# Patient Record
Sex: Female | Born: 1955 | Race: White | Hispanic: No | Marital: Married
Health system: Midwestern US, Academic
[De-identification: ages and names within clinical notes are randomized; demographics above are authoritative.]

---

## 2017-07-05 ENCOUNTER — Encounter: Admit: 2017-07-05 | Discharge: 2017-07-05 | Payer: BC Managed Care – HMO

## 2017-07-13 ENCOUNTER — Encounter: Admit: 2017-07-13 | Discharge: 2017-07-13 | Payer: BC Managed Care – HMO

## 2017-07-13 DIAGNOSIS — F329 Major depressive disorder, single episode, unspecified: ICD-10-CM

## 2017-07-13 DIAGNOSIS — F419 Anxiety disorder, unspecified: ICD-10-CM

## 2017-07-13 DIAGNOSIS — K5790 Diverticulosis of intestine, part unspecified, without perforation or abscess without bleeding: Principal | ICD-10-CM

## 2017-07-13 DIAGNOSIS — I10 Essential (primary) hypertension: ICD-10-CM

## 2017-07-15 ENCOUNTER — Encounter: Admit: 2017-07-15 | Discharge: 2017-07-15 | Payer: BC Managed Care – HMO

## 2017-07-15 ENCOUNTER — Ambulatory Visit: Admit: 2017-07-15 | Discharge: 2017-07-15 | Payer: BC Managed Care – HMO

## 2017-07-15 DIAGNOSIS — I10 Essential (primary) hypertension: ICD-10-CM

## 2017-07-15 DIAGNOSIS — K5790 Diverticulosis of intestine, part unspecified, without perforation or abscess without bleeding: Principal | ICD-10-CM

## 2017-07-15 DIAGNOSIS — M5442 Lumbago with sciatica, left side: Principal | ICD-10-CM

## 2017-07-15 DIAGNOSIS — M5440 Lumbago with sciatica, unspecified side: ICD-10-CM

## 2017-07-15 DIAGNOSIS — M48062 Spinal stenosis, lumbar region with neurogenic claudication: ICD-10-CM

## 2017-07-15 DIAGNOSIS — F419 Anxiety disorder, unspecified: ICD-10-CM

## 2017-07-15 DIAGNOSIS — R0989 Other specified symptoms and signs involving the circulatory and respiratory systems: ICD-10-CM

## 2017-07-15 DIAGNOSIS — F329 Major depressive disorder, single episode, unspecified: ICD-10-CM

## 2017-07-16 ENCOUNTER — Encounter: Admit: 2017-07-16 | Discharge: 2017-07-16 | Payer: BC Managed Care – HMO

## 2017-07-16 DIAGNOSIS — M48062 Spinal stenosis, lumbar region with neurogenic claudication: Principal | ICD-10-CM

## 2017-07-19 ENCOUNTER — Encounter: Admit: 2017-07-19 | Discharge: 2017-07-19 | Payer: BC Managed Care – HMO

## 2017-07-19 DIAGNOSIS — R0989 Other specified symptoms and signs involving the circulatory and respiratory systems: Principal | ICD-10-CM

## 2017-07-19 MED ORDER — ASPIRIN 81 MG PO CHEW
81 mg | ORAL_TABLET | Freq: Every day | ORAL | 3 refills | Status: AC
Start: 2017-07-19 — End: 2018-07-29

## 2017-07-24 ENCOUNTER — Ambulatory Visit: Admit: 2017-07-24 | Discharge: 2017-07-24 | Payer: BC Managed Care – HMO

## 2017-07-24 DIAGNOSIS — M48062 Spinal stenosis, lumbar region with neurogenic claudication: Principal | ICD-10-CM

## 2017-08-20 ENCOUNTER — Encounter: Admit: 2017-08-20 | Discharge: 2017-08-20 | Payer: BC Managed Care – HMO

## 2017-08-20 ENCOUNTER — Ambulatory Visit: Admit: 2017-08-20 | Discharge: 2017-08-20 | Payer: BC Managed Care – HMO

## 2017-08-20 DIAGNOSIS — M48062 Spinal stenosis, lumbar region with neurogenic claudication: ICD-10-CM

## 2017-08-20 DIAGNOSIS — K5790 Diverticulosis of intestine, part unspecified, without perforation or abscess without bleeding: Principal | ICD-10-CM

## 2017-08-20 DIAGNOSIS — F329 Major depressive disorder, single episode, unspecified: ICD-10-CM

## 2017-08-20 DIAGNOSIS — M4317 Spondylolisthesis, lumbosacral region: Principal | ICD-10-CM

## 2017-08-20 DIAGNOSIS — I10 Essential (primary) hypertension: ICD-10-CM

## 2017-08-20 DIAGNOSIS — F419 Anxiety disorder, unspecified: ICD-10-CM

## 2017-08-22 ENCOUNTER — Ambulatory Visit: Admit: 2017-08-22 | Discharge: 2017-08-23 | Payer: BC Managed Care – HMO

## 2017-08-22 ENCOUNTER — Encounter: Admit: 2017-08-22 | Discharge: 2017-08-22 | Payer: BC Managed Care – HMO

## 2017-08-22 DIAGNOSIS — I10 Essential (primary) hypertension: ICD-10-CM

## 2017-08-22 DIAGNOSIS — F419 Anxiety disorder, unspecified: ICD-10-CM

## 2017-08-22 DIAGNOSIS — K5790 Diverticulosis of intestine, part unspecified, without perforation or abscess without bleeding: Principal | ICD-10-CM

## 2017-08-22 DIAGNOSIS — F329 Major depressive disorder, single episode, unspecified: ICD-10-CM

## 2017-08-22 MED ORDER — MELOXICAM 15 MG PO TAB
15 mg | ORAL_TABLET | Freq: Every day | ORAL | 3 refills | 30.00000 days | Status: AC
Start: 2017-08-22 — End: 2018-04-10

## 2017-08-22 MED ORDER — GABAPENTIN 300 MG PO CAP
ORAL_CAPSULE | Freq: Two times a day (BID) | 5 refills | Status: AC
Start: 2017-08-22 — End: 2018-04-10

## 2017-08-23 DIAGNOSIS — M4317 Spondylolisthesis, lumbosacral region: ICD-10-CM

## 2017-08-23 DIAGNOSIS — M48062 Spinal stenosis, lumbar region with neurogenic claudication: Principal | ICD-10-CM

## 2017-08-23 DIAGNOSIS — M5116 Intervertebral disc disorders with radiculopathy, lumbar region: ICD-10-CM

## 2018-04-10 ENCOUNTER — Encounter: Admit: 2018-04-10 | Discharge: 2018-04-10 | Payer: BC Managed Care – HMO

## 2018-04-10 MED ORDER — MELOXICAM 15 MG PO TAB
15 mg | ORAL_TABLET | Freq: Every day | ORAL | 2 refills | 30.00000 days | Status: DC
Start: 2018-04-10 — End: 2018-08-28

## 2018-04-10 MED ORDER — GABAPENTIN 300 MG PO CAP
ORAL_CAPSULE | Freq: Two times a day (BID) | 2 refills | Status: DC
Start: 2018-04-10 — End: 2018-05-22

## 2018-05-01 ENCOUNTER — Encounter: Admit: 2018-05-01 | Discharge: 2018-05-01 | Payer: BC Managed Care – HMO

## 2018-05-22 ENCOUNTER — Encounter: Admit: 2018-05-22 | Discharge: 2018-05-22 | Payer: BC Managed Care – HMO

## 2018-05-22 ENCOUNTER — Ambulatory Visit: Admit: 2018-05-22 | Discharge: 2018-05-23 | Payer: BC Managed Care – HMO

## 2018-05-22 DIAGNOSIS — F419 Anxiety disorder, unspecified: ICD-10-CM

## 2018-05-22 DIAGNOSIS — K5790 Diverticulosis of intestine, part unspecified, without perforation or abscess without bleeding: Principal | ICD-10-CM

## 2018-05-22 DIAGNOSIS — I10 Essential (primary) hypertension: ICD-10-CM

## 2018-05-22 DIAGNOSIS — F329 Major depressive disorder, single episode, unspecified: ICD-10-CM

## 2018-05-22 MED ORDER — DULOXETINE 30 MG PO CPDR
ORAL_CAPSULE | ORAL | 3 refills | 60.00000 days | Status: AC
Start: 2018-05-22 — End: ?

## 2018-05-22 NOTE — Progress Notes
SPINE CENTER HISTORY AND PHYSICAL    Chief Complaint   Patient presents with   ??? Spine - Leg Pain   ??? Follow Up            HISTORY OF PRESENT ILLNESS:      Kristen Osborne returns today for pain in the bilateral lower extremities.  Last saw her in August 2019.  Her pain is remained about the same since then.  She has a constant aching and throbbing sensation.  Her average pain level is 6 or 7 out of 10.  The entire bilateral lower extremities are involved, but pain is worse in the anterior thighs and calves.  It is equal on the left and the right.  There is no back pain.  She denies any numbness, tingling, or weakness.  She believes the muscle bulk of her calves has decreased somewhat.  Her pain is worse with walking and standing and a little better with rest but not absent.  She has been taking meloxicam and gabapentin but is not convinced they are helping.  She has not noticed any side effects.  She previously had MRI of the lumbar spine with some mild central canal stenosis.  She was also evaluated by vascular surgery who did not believe there is any claudication.  She finished 6 weeks of physical therapy for core strengthening which did not improve her pain at all.            Medical History:   Diagnosis Date   ??? Anxiety disorder    ??? Depression    ??? Diverticulosis    ??? Hypertension        Surgical History:   Procedure Laterality Date   ??? HX HYSTERECTOMY     ??? OOPHORECTOMY         family history includes None Reported in her father and mother.    Social History     Socioeconomic History   ??? Marital status: Married     Spouse name: Not on file   ??? Number of children: Not on file   ??? Years of education: Not on file   ??? Highest education level: Not on file   Occupational History   ??? Not on file   Tobacco Use   ??? Smoking status: Former Smoker   ??? Smokeless tobacco: Never Used   Substance and Sexual Activity   ??? Alcohol use: Not on file   ??? Drug use: Not on file   ??? Sexual activity: Not on file Other Topics Concern   ??? Not on file   Social History Narrative   ??? Not on file       Allergies   Allergen Reactions   ??? Azithromycin UNKNOWN   ??? Cefuroxime UNKNOWN   ??? Erythromycin UNKNOWN   ??? Pcn [Penicillins] UNKNOWN   ??? Sulfa (Sulfonamide Antibiotics) UNKNOWN         Current Outpatient Medications:   ???  amLODIPine (NORVASC) 10 mg tablet, Take 10 mg by mouth daily., Disp: , Rfl:   ???  aspirin 81 mg chewable tablet, Chew one tablet by mouth daily. Take with food., Disp: 90 tablet, Rfl: 3  ???  cilostazol(+) (PLETAL) 100 mg tablet, Take 100 mg by mouth twice daily. Take on an empty stomach at least 30 minutes before or 2 hours after food., Disp: , Rfl:   ???  duloxetine DR (CYMBALTA) 30 mg capsule, 1 po qd x 1 week, then 2 po qd., Disp: 60 capsule,  Rfl: 3  ???  lisinopril (PRINIVIL; ZESTRIL) 10 mg tablet, Take 10 mg by mouth daily., Disp: , Rfl:   ???  meloxicam (MOBIC) 15 mg tablet, Take one tablet by mouth daily., Disp: 90 tablet, Rfl: 2    Vitals:    05/22/18 1406   BP: 132/79   Pulse: 82   Temp: 36.7 ???C (98 ???F)   SpO2: 99%   Weight: 54.4 kg (120 lb)   Height: 162.6 cm (64)   PainSc: Six            No data recorded  Is a controlled substance agreement on file?No    Pain Score: Six    Body mass index is 20.6 kg/m???.    Review of Systems   Constitutional: Negative.    HENT: Negative.    Eyes: Negative.    Respiratory: Negative.    Cardiovascular: Negative.    Gastrointestinal: Negative.    Endocrine: Negative.    Genitourinary: Negative.    Musculoskeletal: Positive for myalgias.   Skin: Negative.    Allergic/Immunologic: Negative.    Neurological: Negative.    Hematological: Negative.    Psychiatric/Behavioral: Negative.             PHYSICAL EXAM:      General: Alert, cooperative, no acute distress.  HEENT: Normocephalic, atraumatic.  Neck: Supple.  Lungs: Unlabored respirations, bilateral and equal chest excursion.  Cardiovascular: Regular rate by palpation of pulse.  Strong palpable

## 2018-05-23 DIAGNOSIS — M48062 Spinal stenosis, lumbar region with neurogenic claudication: Principal | ICD-10-CM

## 2018-05-23 DIAGNOSIS — M5416 Radiculopathy, lumbar region: ICD-10-CM

## 2018-05-23 DIAGNOSIS — M79604 Pain in right leg: ICD-10-CM

## 2018-05-23 DIAGNOSIS — M79605 Pain in left leg: ICD-10-CM

## 2018-05-29 ENCOUNTER — Encounter: Admit: 2018-05-29 | Discharge: 2018-05-29 | Payer: BC Managed Care – HMO

## 2018-05-29 MED ORDER — GABAPENTIN 300 MG PO CAP
300 mg | ORAL_CAPSULE | Freq: Three times a day (TID) | ORAL | 5 refills | Status: DC
Start: 2018-05-29 — End: 2018-08-05

## 2018-05-30 NOTE — Telephone Encounter
Patient calling back in stating that she doesn't want to take the Duloxetine any longer and wants to go back to the Gabapentin. Instructed patient that if she chooses to not take the Duloxetine that is perfectly fine and she could continue with the Gabapenin. Patient asking for me to get this approved through Dr. Evelena Leyden. Instructed her that I would review with Dr. Loreli Dollar with Dr. Evelena Leyden and he is fine with patient going back to previous orders. Patient can quit taking the duloxetine and go back to taking the Gabapentin. Instructed patient that a refill was sent in with 5 refills

## 2018-06-12 ENCOUNTER — Encounter: Admit: 2018-06-12 | Discharge: 2018-06-12

## 2018-06-12 NOTE — Telephone Encounter
Patient calling in stating she spoke to a [employer] rep and was told her insurance does cover [facility]. Verified insurance. Verified Insurance was not covered in the [place]

## 2018-07-08 ENCOUNTER — Encounter: Admit: 2018-07-08 | Discharge: 2018-07-08

## 2018-07-08 DIAGNOSIS — K5790 Diverticulosis of intestine, part unspecified, without perforation or abscess without bleeding: Secondary | ICD-10-CM

## 2018-07-08 DIAGNOSIS — F419 Anxiety disorder, unspecified: Secondary | ICD-10-CM

## 2018-07-08 DIAGNOSIS — M5416 Radiculopathy, lumbar region: Secondary | ICD-10-CM

## 2018-07-08 DIAGNOSIS — I10 Essential (primary) hypertension: Secondary | ICD-10-CM

## 2018-07-08 DIAGNOSIS — M48062 Spinal stenosis, lumbar region with neurogenic claudication: Secondary | ICD-10-CM

## 2018-07-08 DIAGNOSIS — F329 Major depressive disorder, single episode, unspecified: Secondary | ICD-10-CM

## 2018-07-08 MED ORDER — DEXAMETHASONE SODIUM PHOS (PF) 10 MG/ML IJ SOLN
10 mg | Freq: Once | 0 refills | Status: CP
Start: 2018-07-08 — End: ?
  Administered 2018-07-08: 17:00:00 10 mg

## 2018-07-08 MED ORDER — IOHEXOL 240 MG IODINE/ML IV SOLN
2.5 mL | Freq: Once | EPIDURAL | 0 refills | Status: CP
Start: 2018-07-08 — End: ?
  Administered 2018-07-08: 17:00:00 2.5 mL via EPIDURAL

## 2018-07-08 NOTE — Progress Notes

## 2018-07-08 NOTE — Progress Notes
SPINE CENTER HISTORY AND PHYSICAL    Chief Complaint   Patient presents with   ??? Lower Back - Pain            HISTORY OF PRESENT ILLNESS:      Lynesha Bango returns today for pain in the bilateral lower extremities.  Last saw her in August 2019.  Her pain is remained about the same since then.  She has a constant aching and throbbing sensation.  Her average pain level is 6 or 7 out of 10.  The entire bilateral lower extremities are involved, but pain is worse in the anterior thighs and calves.  It is equal on the left and the right.  There is no back pain.  She denies any numbness, tingling, or weakness.  She believes the muscle bulk of her calves has decreased somewhat.  Her pain is worse with walking and standing and a little better with rest but not absent.  She has been taking meloxicam and gabapentin but is not convinced they are helping.  She has not noticed any side effects.  She previously had MRI of the lumbar spine with some mild central canal stenosis.  She was also evaluated by vascular surgery who did not believe there is any claudication.  She finished 6 weeks of physical therapy for core strengthening which did not improve her pain at all.            Medical History:   Diagnosis Date   ??? Anxiety disorder    ??? Depression    ??? Diverticulosis    ??? Hypertension        Surgical History:   Procedure Laterality Date   ??? HX HYSTERECTOMY     ??? OOPHORECTOMY         family history includes None Reported in her father and mother.    Social History     Socioeconomic History   ??? Marital status: Married     Spouse name: Not on file   ??? Number of children: Not on file   ??? Years of education: Not on file   ??? Highest education level: Not on file   Occupational History   ??? Not on file   Tobacco Use   ??? Smoking status: Former Smoker   ??? Smokeless tobacco: Never Used   Substance and Sexual Activity   ??? Alcohol use: Not on file   ??? Drug use: Not on file   ??? Sexual activity: Not on file   Other Topics Concern ??? Not on file   Social History Narrative   ??? Not on file       Allergies   Allergen Reactions   ??? Azithromycin UNKNOWN   ??? Cefuroxime UNKNOWN   ??? Erythromycin UNKNOWN   ??? Pcn [Penicillins] UNKNOWN   ??? Sulfa (Sulfonamide Antibiotics) UNKNOWN         Current Outpatient Medications:   ???  amLODIPine (NORVASC) 10 mg tablet, Take 10 mg by mouth daily., Disp: , Rfl:   ???  aspirin 81 mg chewable tablet, Chew one tablet by mouth daily. Take with food., Disp: 90 tablet, Rfl: 3  ???  cilostazol(+) (PLETAL) 100 mg tablet, Take 100 mg by mouth twice daily. Take on an empty stomach at least 30 minutes before or 2 hours after food., Disp: , Rfl:   ???  duloxetine DR (CYMBALTA) 30 mg capsule, 1 po qd x 1 week, then 2 po qd., Disp: 60 capsule, Rfl: 3  ???  gabapentin (NEURONTIN) 300 mg capsule, Take one capsule by mouth three times daily., Disp: 90 capsule, Rfl: 5  ???  lisinopril (PRINIVIL; ZESTRIL) 10 mg tablet, Take 10 mg by mouth daily., Disp: , Rfl:   ???  meloxicam (MOBIC) 15 mg tablet, Take one tablet by mouth daily., Disp: 90 tablet, Rfl: 2    Current Facility-Administered Medications:   ???  dexamethasone PF (DECADRON) injection 10 mg, 10 mg, SEE ADMIN INSTRUCTIONS, ONCE, Philomena Course, MD  ???  iohexoL (OMNIPAQUE-240) 240 mg/mL injection 2.5 mL, 2.5 mL, Epidural, ONCE, Philomena Course, MD    Vitals:    07/08/18 1112   BP: (!) 140/90   Pulse: 85   Temp: 36.9 ???C (98.5 ???F)   TempSrc: Oral   SpO2: 97%   Weight: 52.2 kg (115 lb)   Height: 162.6 cm (64)   PainSc: Eight            No data recorded  Is a controlled substance agreement on file?No    Pain Score: Eight    Body mass index is 19.74 kg/m???.    Review of Systems   Constitutional: Negative.    HENT: Negative.    Eyes: Negative.    Respiratory: Negative.    Cardiovascular: Negative.    Gastrointestinal: Negative.    Endocrine: Negative.    Genitourinary: Negative.    Musculoskeletal: Positive for myalgias.   Skin: Negative.    Allergic/Immunologic: Negative.    Neurological: Negative. Hematological: Negative.    Psychiatric/Behavioral: Negative.             PHYSICAL EXAM:      General: Alert, cooperative, no acute distress.  HEENT: Normocephalic, atraumatic.  Neck: Supple.  Lungs: Unlabored respirations, bilateral and equal chest excursion.  Cardiovascular: Regular rate by palpation of pulse.  Strong palpable pulses in the bilateral dorsalis pedis and posterior tibialis.  Skin: Warm and dry to touch.  Abdomen: Nondistended.    Lumbar spine:  Lumbar tenderness: No  SI joint tenderness: No  Pain with extension: No  Pain with lateral flexion: No  FABER: negative  FADIR: negative  Lower extremity strength: 5/5 bilaterally  Sensation to light touch: Intact and equal in the bilateral lower extremities  Straight leg raise: negative  Reflexes:  2/4 in bilateral patellar and achilles tendons    Neurological: Alert and oriented x3.     RADIOGRAPHIC EVALUATION:    MRI L spine 07/2017  IMPRESSION      1. ???Mild chronic inferior L1 and superior L3 endplate volume   loss/compression.  2. 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 Concomitant degenerative findings at this level result in   mild left foraminal stenosis.  3. ???Multilevel degenerative mild central spinal and lateral recess   stenosis from L2-3 through L4-5 as detailed. No other significant   foraminal stenosis is identified.    IMPRESSION:    1. Neurogenic claudication due to lumbar spinal stenosis    2. Bilateral leg pain    3. Lumbar radiculopathy          PLAN:   Bilat L4-5 TFESI

## 2018-07-08 NOTE — Patient Instructions
Procedure Completed Today: Lumbar Transforaminal Steroid Injection    Important information following your procedure today: You may drive today    1. Pain relief may not be immediate. It is possible you may even experience an increase in pain during the first 24-48 hours followed by a gradual decrease of your pain.  2. Though the procedure is generally safe and complications are rare, we do ask that you be aware of any of the following:   ? Any swelling, persistent redness, new bleeding, or drainage from the site of the injection.  ? You should not experience a severe headache.  ? You should not run a fever over 101??? F.  ? New onset of sharp, severe back & or neck pain.  ? New onset of upper or lower extremity numbness or weakness.  ? New difficulty controlling bowel or bladder function after the injection.  ? New shortness of breath.    If any of these occur, please call to report this occurrence to a nurse at [phone]. If you are calling after 4:00 p.m., on weekends or holidays please call [phone] and ask to have the resident physician on call for the physician paged or go to your local emergency room.  3. You may experience soreness at the injection site. Ice can be applied at 20 minute intervals. Avoid application of direct heat, hot showers or hot tubs today.  4. Avoid strenuous activity today. You may resume your regular activities and exercise tomorrow.  5. Patients with diabetes may see an elevation in blood sugars for 7-10 days after the injection. It is important to pay close attention to your diet, check your blood sugars daily and report extreme elevations to the physician that treats your diabetes.  6. Patients taking a daily blood thinner can resume their regular dose this evening.  7. It is important that you take all medications ordered by your pain physician. Taking medication as ordered is an important part of your pain care plan. If you cannot continue the medication plan, please notify the physician.     Possible side effects to steroids that may occur:  ? Flushing or redness of the face  ? Irritability  ? Fluid retention  ? Change in women???s menses    The following medications were used: Lidocaine , Decadron, Contrast Dye and Normal Saline

## 2018-07-09 ENCOUNTER — Ambulatory Visit: Admit: 2018-07-08 | Discharge: 2018-07-09

## 2018-07-09 DIAGNOSIS — Z791 Long term (current) use of non-steroidal anti-inflammatories (NSAID): Secondary | ICD-10-CM

## 2018-07-09 DIAGNOSIS — M79604 Pain in right leg: Secondary | ICD-10-CM

## 2018-07-09 DIAGNOSIS — Z7982 Long term (current) use of aspirin: Secondary | ICD-10-CM

## 2018-07-09 DIAGNOSIS — Z87891 Personal history of nicotine dependence: Secondary | ICD-10-CM

## 2018-07-09 DIAGNOSIS — M79605 Pain in left leg: Secondary | ICD-10-CM

## 2018-07-09 NOTE — Procedures
Attending Surgeon: Philomena Course, MD    Anesthesia: Local    Pre-Procedure Diagnosis:   1. Neurogenic claudication due to lumbar spinal stenosis    2. Bilateral leg pain    3. Lumbar radiculopathy        Post-Procedure Diagnosis:   1. Neurogenic claudication due to lumbar spinal stenosis    2. Bilateral leg pain    3. Lumbar radiculopathy         AMB SPINE INJECT SNRB/TFESI LUMBAR/SACRAL  Procedure: transforaminal epidural    Laterality: bilateral    Location: lumbar - L4-5      Consent:   Consent obtained: written  Consent given by: patient  Risks discussed: allergic reaction, reaction to medication, bleeding, bruising, seizure, nerve damage, weakness, no change or worsening in pain and swelling  Alternatives discussed: alternative treatment, delayed treatment, referral and no treatment  Discussed with patient the purpose of the treatment/procedure, other ways of treating my condition, including no treatment/ procedure and the risks and benefits of the alternatives. Patient has decided to proceed with treatment/procedure.        Universal Protocol:  Relevant documents: relevant documents present and verified  Test results: test results available and properly labeled  Imaging studies: imaging studies available  Required items: required blood products, implants, devices, and special equipment available  Site marked: the operative site was marked  Patient identity confirmed: Patient identify confirmed verbally with patient.        Time out: Immediately prior to procedure a time out was called to verify the correct patient, procedure, equipment, support staff and site/side marked as required      Procedures Details:   Indications: pain   Prep: chlorhexidine  Patient position: prone  Estimated Blood Loss: minimal  Specimens: none  Amount Injected:   L4-5: 3mL    Number of Levels: 1  Approach: paramedian (bilateral)  Guidance: fluoroscopy  Contrast: Procedure confirmed with contrast under live fluoroscopy. Needle and Epidural Catheter: quincke  Needle size: 25 G  Injection procedure: Incremental injection and Negative aspiration for blood  Patient tolerance: Patient tolerated the procedure well with no immediate complications. Pressure was applied, and hemostasis was accomplished.  Comments: 5mg  of dexamethasone and 1mL preservative-free normal saline injected at each of 2 locations        Estimated blood loss: none or minimal  Specimens: none  Patient tolerated the procedure well with no immediate complications. Pressure was applied, and hemostasis was accomplished.

## 2018-07-10 ENCOUNTER — Encounter: Admit: 2018-07-10 | Discharge: 2018-07-10

## 2018-07-29 MED ORDER — ASPIRIN 81 MG PO CHEW
ORAL_TABLET | Freq: Every day | 3 refills | Status: AC
Start: 2018-07-29 — End: ?

## 2018-08-01 ENCOUNTER — Encounter: Admit: 2018-08-01 | Discharge: 2018-08-01

## 2018-08-01 DIAGNOSIS — M791 Myalgia, unspecified site: Secondary | ICD-10-CM

## 2018-08-01 DIAGNOSIS — M79604 Pain in right leg: Secondary | ICD-10-CM

## 2018-08-01 NOTE — Telephone Encounter
Patient called still having pain from injection on 07/08/2018. Pain is similar to pain prior to injection. Patient asking if there is anything additional she can do for her pain. Patient is taking cymbalta, gabapentin and meloxicam.   Rn advised would consult with Dr. Delene Ruffini and get back to her with plan. Patient currently has no follow-up appointments scheduled. Patient agreed to plan.

## 2018-08-05 MED ORDER — GABAPENTIN 600 MG PO TAB
600 mg | ORAL_TABLET | Freq: Three times a day (TID) | ORAL | 6 refills | Status: DC
Start: 2018-08-05 — End: 2018-12-25

## 2018-08-05 NOTE — Telephone Encounter
Called patient to advise per Dr. Delene Ruffini-  I ordered ESR, CMP, CK- please have her get these labs at her convenience to rule out inflammatory or electrolyte causes of pain. Can be at any lab she prefers. I referred to rheumatology to eval futher since pain does not seem to be from the spine or vascular. She should increase gabapentin by 371m every 3 days until at 6029mtid. Ok for new Rx at this dose, 6 months of refills.       [facility] phone # [phone] [phone] faxed orders and notified patient.     Patient wants to see [facility]. RN advised referral was placed and patient will receive a call from them to schedule appointment.    Instructions giving from gabapentin increase to 600 gm tid. Patient verbalized understanding of increase and new prescription sent for increased dose and 6 months refills per Dr. SaDelene Ruffini   Patient was appreciative of call and agreed to plan.

## 2018-08-06 ENCOUNTER — Encounter: Admit: 2018-08-06 | Discharge: 2018-08-06

## 2018-08-06 NOTE — Telephone Encounter
Patient calling today about rheumatology referral that was placed for her by Dr. Delene Ruffini.  Patient states she found a different rheumatologist that comes to [place], which is closer to where she lives, and she wants a referral to go see him.  RN explained that a referral was already placed at [facility].  If she wants to see someone else, she will need to get a referral to do so from her PCP.  In the meantime, [facility] referral remains active, as patient hasn't yet decided what she wants to do.  Patient demonstrates understanding and is agreeable to plan.

## 2018-08-27 ENCOUNTER — Encounter: Admit: 2018-08-27 | Discharge: 2018-08-27

## 2018-08-27 NOTE — Telephone Encounter
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 Patient hasn't taken Meloxicam or Gabapentin since this has all happened and are reluctant to restart the Meloxicam since its a NSAID. Would like Dr. Laurann Montana recommendations on restarting the Gabapentin. Asked if when they took her off Gabapentin did they titrate her off. Stated they did not titrate her but patient had no side effects from doing so.     Instructed patient's husband that Dr. Delene Ruffini had put in a referral to Rheumatology and asked if they had been seen by them. He stated that she had not. Provided telephone number to that department to get that referral appointment rescheduled as Dr. Delene Ruffini believes her pain may be due to this and not spine or vascular in nature.       He will follow up with Rheumatology and would like to know Dr. Laurann Montana recommendation on the Gabapentin. Instructed him as soon as I get his response I would call him back.

## 2018-08-28 ENCOUNTER — Encounter: Admit: 2018-08-28 | Discharge: 2018-08-28

## 2018-08-28 NOTE — Telephone Encounter
Per Dr. Delene Ruffini:    D/c meloxicam. Gabapentin is safe. Start with 600mg  at night, increase add a 600mg  dose every 3 days until back to 600mg  tid.     Patient notified. Verified Titrations schedule with patient's husband. Instructed if any concerns or questions to please call back

## 2018-12-25 ENCOUNTER — Encounter: Admit: 2018-12-25 | Discharge: 2018-12-25 | Payer: BC Managed Care – HMO

## 2018-12-25 MED ORDER — GABAPENTIN 300 MG PO CAP
600 mg | ORAL_CAPSULE | Freq: Three times a day (TID) | ORAL | 5 refills | Status: AC
Start: 2018-12-25 — End: ?

## 2018-12-25 NOTE — Telephone Encounter
Pharmacy calling as patient is taking 600mg   Capsule of Gabapentin and expressed that size of pill is very hard for her to swallow. Pharmacy asking if we can change medication to where she is taking 2 300mg  capsules as they are smaller and easier for her to swallow. Asked if we approve of this to send new script in with change.    Medication change made per request

## 2023-02-07 IMAGING — CR RIBSRT
3 series · 3 of 3 positions shown · non-contrast
Comparison: none

[ribs ap upper]
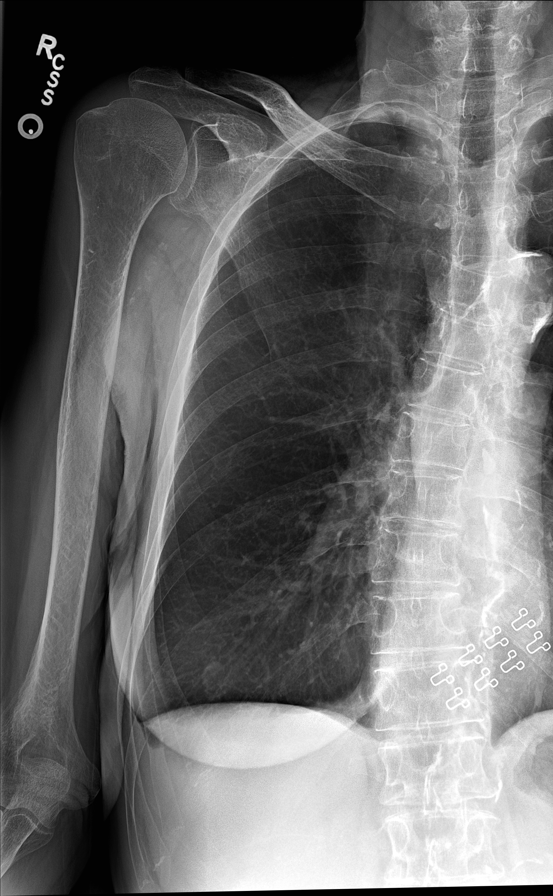

[ribs obl]
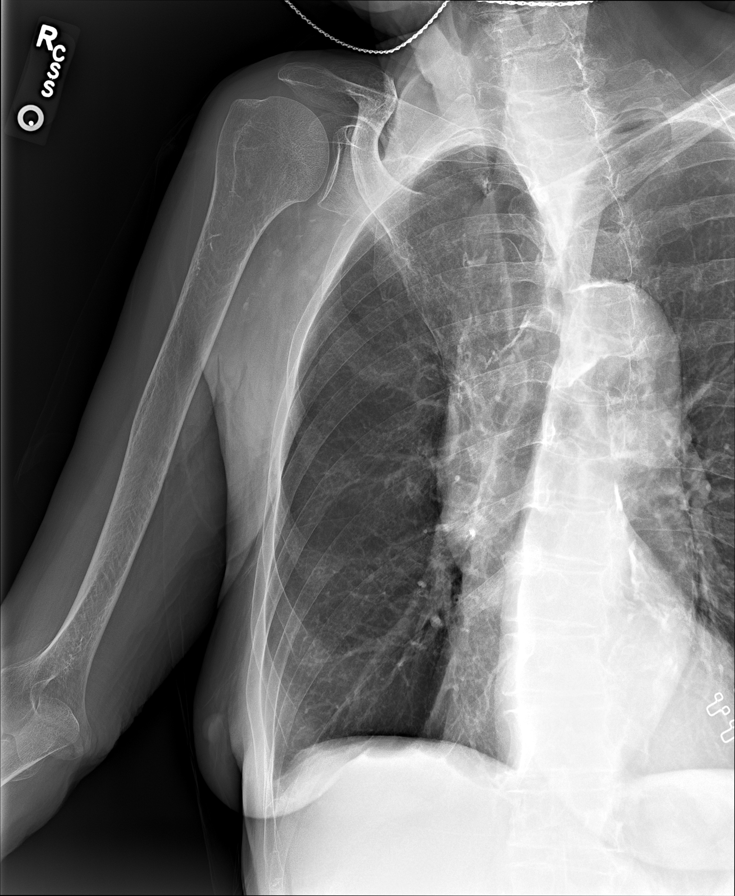

[ribs ap lower]
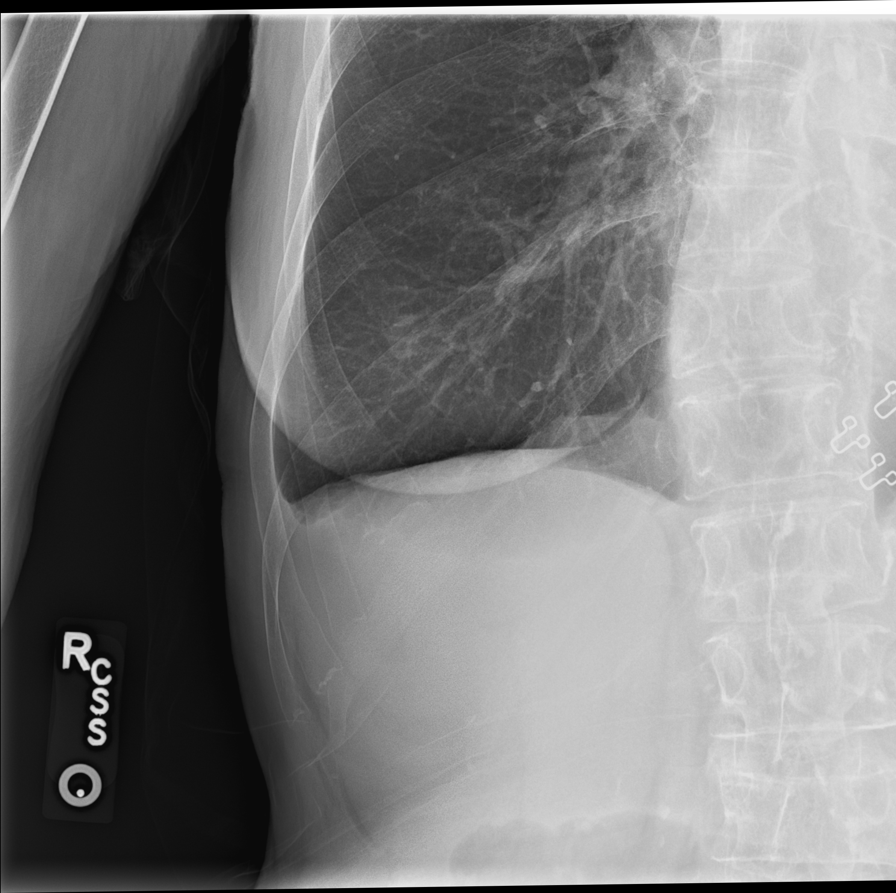

[3 of 3 positions shown; findings below may reference images not displayed]

EXAM

Three-view right ribs

INDICATION

side pain
PT FELL LAST NIGHT WHEN SHE GOT UP TO USE THE RESTROOM. CS

FINDINGS

The bone density is diminished. There is no destruction

No displaced left rib fracture is identified. There is a lucency through the right 5th rib which
may represent artifact. Correlation clinically for pain is needed

IMPRESSION

There is osteopenia. No displaced fractures appreciated involving the right ribs

Tech Notes:

PT FELL LAST NIGHT WHEN SHE GOT UP TO USE THE RESTROOM. CS

## 2023-02-12 ENCOUNTER — Encounter: Admit: 2023-02-12 | Discharge: 2023-02-12 | Payer: BC Managed Care – HMO

## 2023-07-02 DEATH — deceased

## 2024-03-17 IMAGING — CR [ID]
3 series · 3 of 3 positions shown · non-contrast
Comparison: none

[x hand pa left]
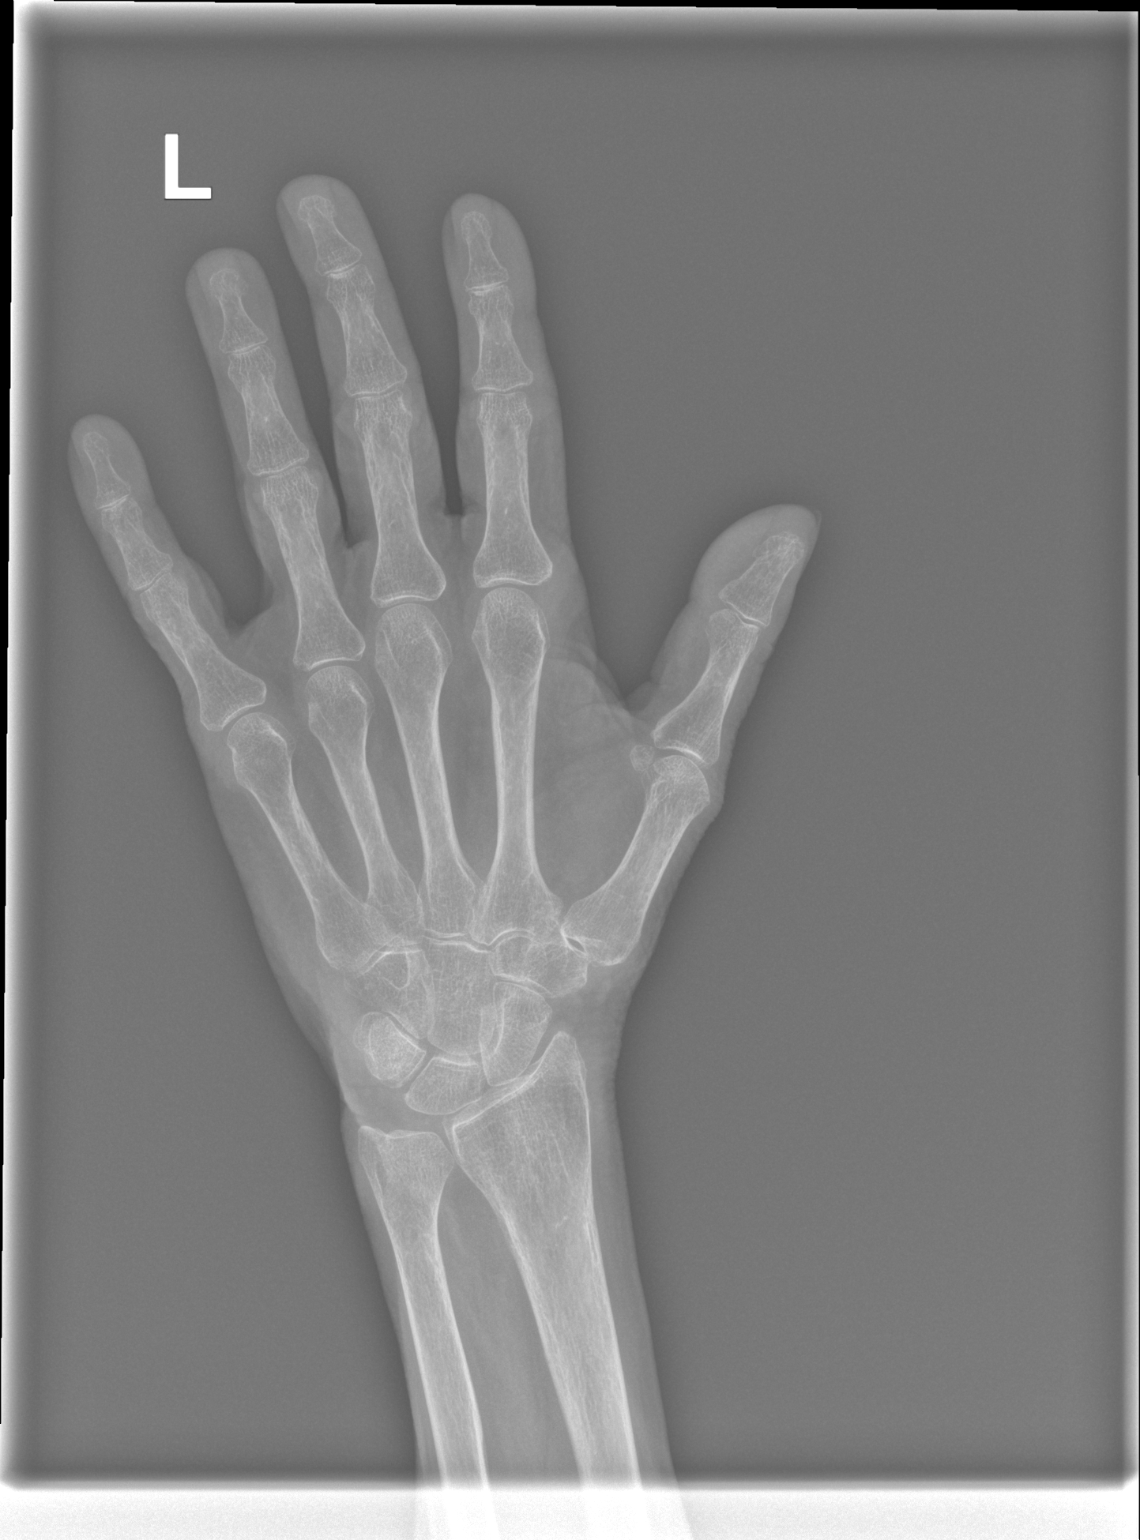

[x hand obl left]
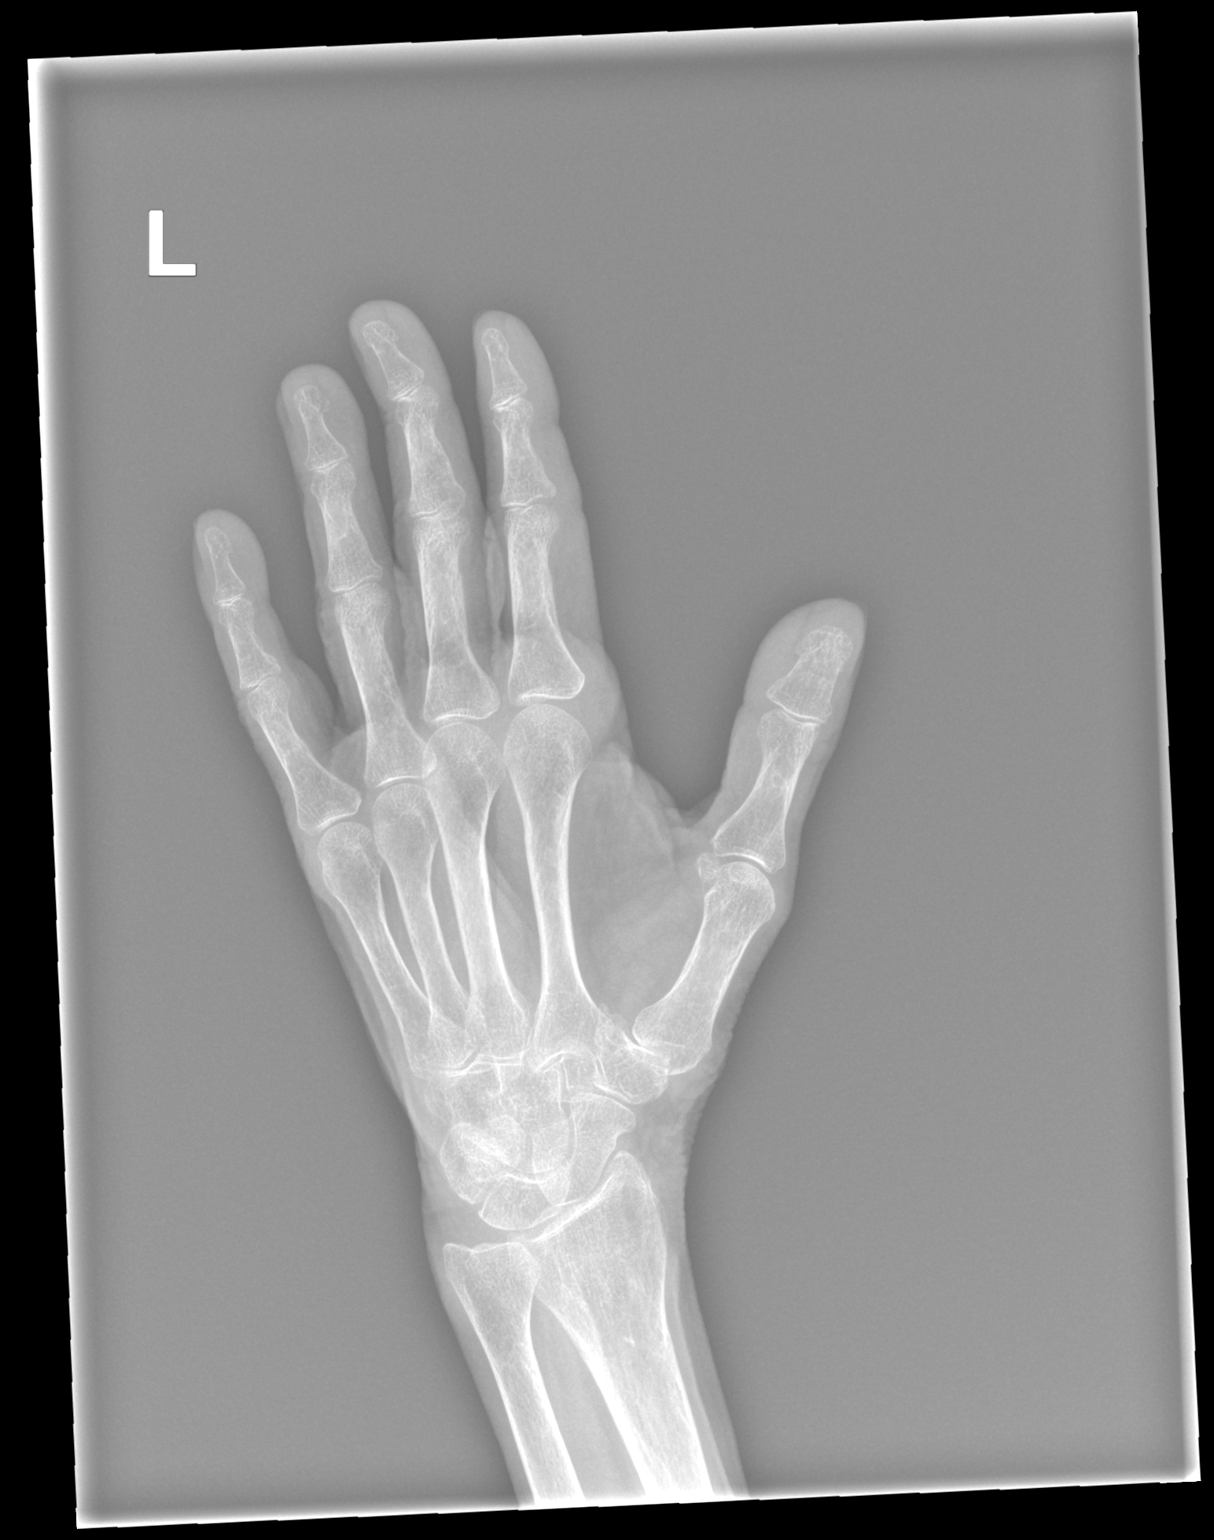

[x hand lat left]
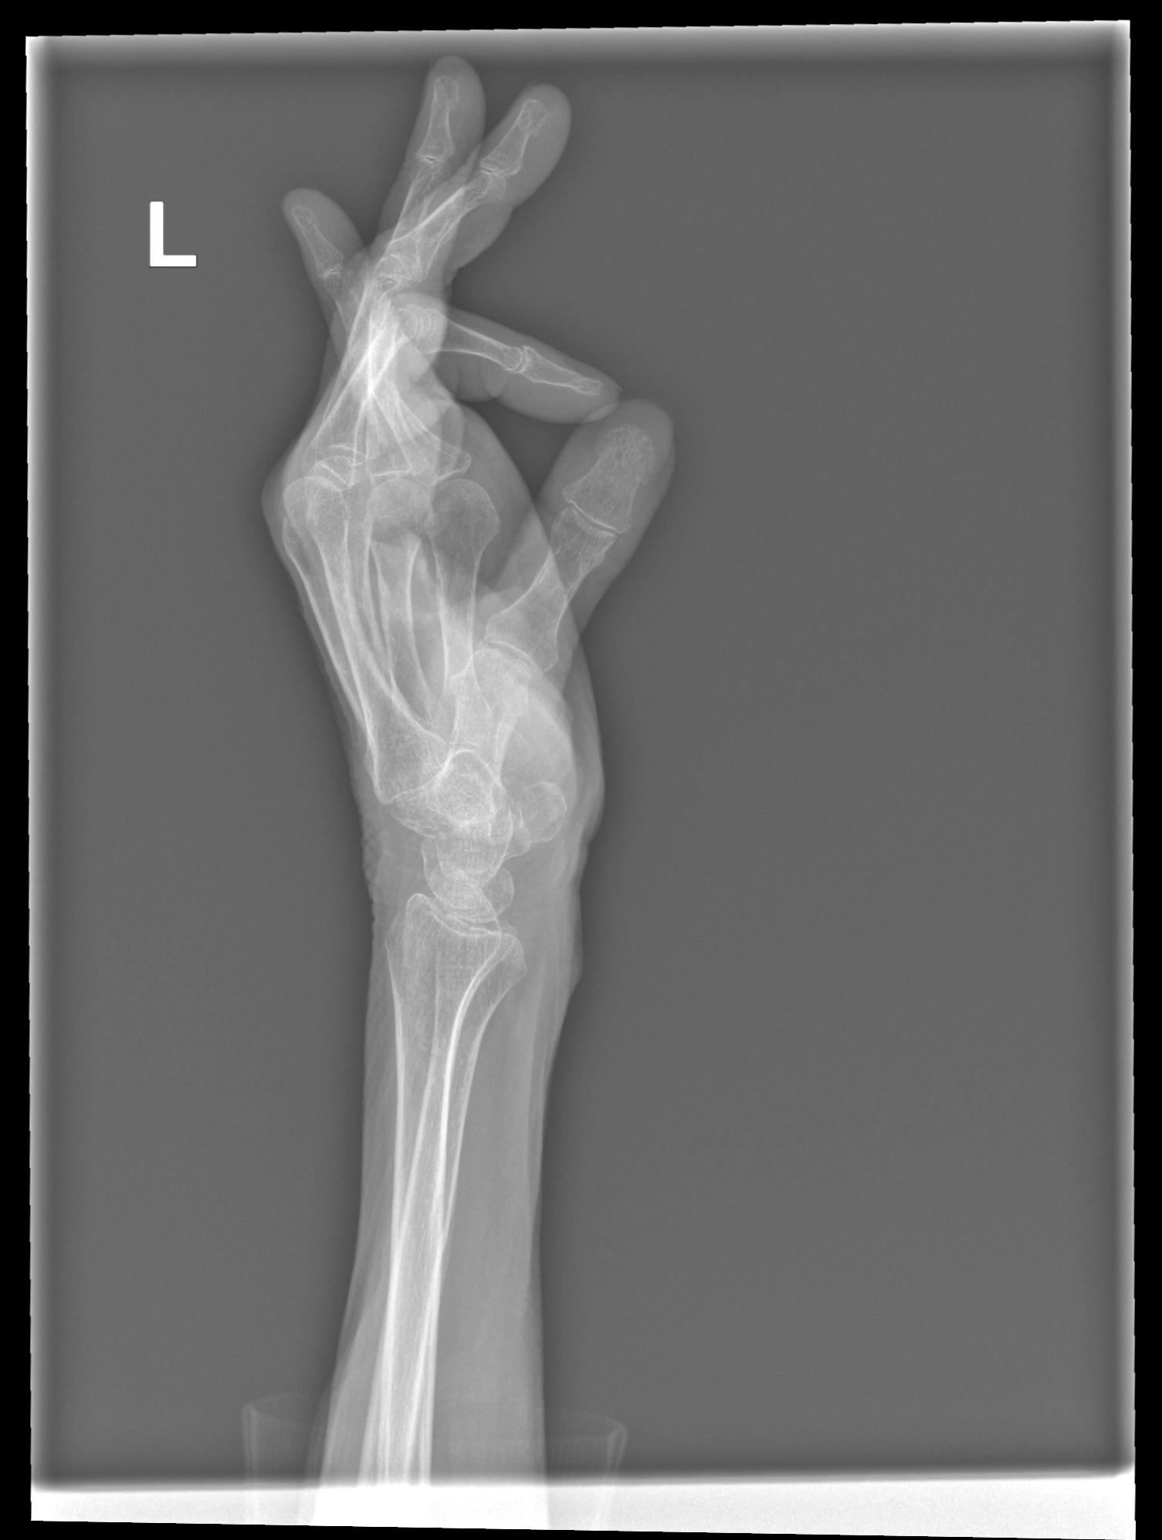

[3 of 3 positions shown; findings below may reference images not displayed]

DIAGNOSTIC STUDIES

EXAM

XR hand LT min 3V

INDICATION

fall, pain
Pt c/o left hand pain. Pt states she fell last week, pain in wrist that radiates into 3rd
metacarpal. HB

TECHNIQUE

Three views

COMPARISONS

Same-day wrist radiographs and June 12, 2021

FINDINGS

There is no fracture or dislocation. Previously suspected triquetral avulsion is not demonstrated.
Soft tissues are unremarkable. Mild 1st carpometacarpal joint osteoarthritis.

IMPRESSION

No acute osseous findings. The previously suggested possible triquetrum avulsion fracture is not
demonstrated, was likely artifactual related to the patient positioning.

Tech Notes:

Pt c/o left hand pain. Pt states she fell last week, pain in wrist that radiates into 3rd
metacarpal. HB
